# Patient Record
Sex: Female | Born: 1990
Health system: Southern US, Community
[De-identification: ages and names within clinical notes are randomized; demographics above are authoritative.]

## PROBLEM LIST (undated history)

## (undated) DIAGNOSIS — J45909 Unspecified asthma, uncomplicated: Secondary | ICD-10-CM

---
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
## 2011-10-29 ENCOUNTER — Encounter (HOSPITAL_COMMUNITY): Payer: Self-pay | Admitting: Emergency Medicine

## 2011-10-29 ENCOUNTER — Emergency Department (HOSPITAL_COMMUNITY)
Admission: EM | Admit: 2011-10-29 | Discharge: 2011-10-29 | Disposition: A | Discharge status: Home / Self Care | Payer: BC Managed Care – PPO | Source: Home / Self Care | Attending: Emergency Medicine | Admitting: Emergency Medicine

## 2011-10-29 DIAGNOSIS — J45901 Unspecified asthma with (acute) exacerbation: Secondary | ICD-10-CM

## 2011-10-29 DIAGNOSIS — J45909 Unspecified asthma, uncomplicated: Secondary | ICD-10-CM

## 2011-10-29 HISTORY — DX: Unspecified asthma, uncomplicated: J45.909

## 2011-10-29 MED ORDER — IPRATROPIUM BROMIDE 0.02 % IN SOLN
0.5000 mg | Freq: Once | RESPIRATORY_TRACT | Status: AC
  Administered 2011-10-29: 0.5 mg via RESPIRATORY_TRACT

## 2011-10-29 MED ORDER — ALBUTEROL SULFATE (5 MG/ML) 0.5% IN NEBU
5.0000 mg | INHALATION_SOLUTION | Freq: Once | RESPIRATORY_TRACT | Status: AC
  Administered 2011-10-29: 5 mg via RESPIRATORY_TRACT

## 2011-10-29 MED ORDER — BECLOMETHASONE DIPROPIONATE 80 MCG/ACT IN AERS: 2.0000 | INHALATION_SPRAY | Freq: Two times a day (BID) | RESPIRATORY_TRACT | Status: AC

## 2011-10-29 MED ORDER — METHYLPREDNISOLONE SODIUM SUCC 125 MG IJ SOLR
125.0000 mg | Freq: Once | INTRAMUSCULAR | Status: AC
  Administered 2011-10-29: 125 mg via INTRAMUSCULAR

## 2011-10-29 MED ORDER — ALBUTEROL SULFATE (5 MG/ML) 0.5% IN NEBU
INHALATION_SOLUTION | RESPIRATORY_TRACT | Status: AC
  Filled 2011-10-29: qty 0.5

## 2011-10-29 MED ORDER — PREDNISONE 5 MG PO KIT: 1.0000 | PACK | Freq: Every day | ORAL | Status: DC

## 2011-10-29 MED ORDER — METHYLPREDNISOLONE SODIUM SUCC 125 MG IJ SOLR
INTRAMUSCULAR | Status: AC
  Filled 2011-10-29: qty 2

## 2011-10-29 NOTE — ED Provider Notes (Signed)
Chief Complaint  Patient presents with  . Asthma    History of Present Illness:   The patient is a 21 year old female who has had asthma as she was a child. She usually maintains this with as needed use of albuterol by MDI inhaler. At times usually brought on by exposure to certain kinds of cats. Last night she was exposed to such a cat and ever since then has had wheezing and chest tightness, productive cough, and aching in her chest and back. She's also had some nasal congestion, rhinorrhea, sneezing, and sore throat. She denies any fever.  Review of Systems:  Other than noted above, the patient denies any of the following symptoms. Systemic:  No fever, chills, sweats, fatigue, myalgias, headache, weight loss or anorexia. Eye:  No redness, itching, or drainage. ENT:  No earache, ear congestion, nasal congestion, sneezing, rhinorrhea, sinus pressure, sinus pain, post nasal drip, or sore throat. Lungs:  No cough, sputum production, or shortness of breath. No chest pain. GI:  No indigestion, heartburn, abdominal pain, nausea, or vomiting. Skin:  No rash or itching.  PMFSH:  Past medical history, family history, social history, meds, and allergies were reviewed.  No history of allergic rhinitis.  No use of tobacco.  Physical Exam:   Vital signs:  BP 147/83  Pulse 122  Temp 98.7 F (37.1 C) (Oral)  Resp 26  SpO2 93%  LMP 10/22/2011 General:  Alert, in mild respiratory distress with all wheezes and some use of accessory muscles but no retractions. Eye:  No conjunctival injection or drainage. Lids were normal. ENT:  TMs and canals were normal, without erythema or inflammation.  Nasal mucosa was clear and uncongested, without drainage.  Mucous membranes were moist.  Pharynx was clear, without exudate or drainage.  There were no oral ulcerations or lesions. Neck:  Supple, no adenopathy, tenderness or mass. Lungs:  No retractions but some use of accessory muscles.  Mild respiratory distress with  some audible wheezes, prolonged respiration, unlabored breathing.  Lungs showed bilateral expiratory wheezes, no rales or rhonchi. Heart:  Regular rhythm, without gallops, murmers or rubs. Skin:  Clear, warm, and dry, without rash or lesions.  Course in Urgent Care Center:   Her pretreatment peak flows were 110 x3. Her pretreatment oxygen level was 93%. She was given DuoNeb via nebulizer and Solu-Medrol 125 mg per thereafter she felt a lot better. She was wheeze free on auscultation, oxygen saturation was 98%, and peak flows were 250, 210, and 240.  Assessment:  The encounter diagnosis was Asthma attack.  Plan:   1.  The following meds were prescribed:   New Prescriptions   BECLOMETHASONE (QVAR) 80 MCG/ACT INHALER    Inhale 2 puffs into the lungs 2 (two) times daily.   PREDNISONE 5 MG KIT    Take 1 kit (5 mg total) by mouth daily after breakfast. Prednisone 5 mg 6 day dosepack.  Take as directed.   She has an albuterol MDI inhaler at home that she can use.  2.  The patient was instructed in symptomatic care and handouts were given. 3.  The patient was told to return if becoming worse in any way, if no better in 3 or 4 days, and given some red flag symptoms that would indicate earlier return.  Follow up:  The patient was told to follow up with her primary care doctor.     Reuben Likes, MD 10/29/11 [phone]

## 2011-10-29 NOTE — Discharge Instructions (Signed)
Asthma Attack Prevention  HOW CAN ASTHMA BE PREVENTED?  Currently, there is no way to prevent asthma from starting. However, you can take steps to control the disease and prevent its symptoms after you have been diagnosed. Learn about your asthma and how to control it. Take an active role to control your asthma by working with your caregiver to create and follow an asthma action plan. An asthma action plan guides you in taking your medicines properly, avoiding factors that make your asthma worse, tracking your level of asthma control, responding to worsening asthma, and seeking emergency care when needed. To track your asthma, keep records of your symptoms, check your peak flow number using a peak flow meter (handheld device that shows how well air moves out of your lungs), and get regular asthma checkups.   Other ways to prevent asthma attacks include:   Use medicines as your caregiver directs.   Identify and avoid things that make your asthma worse (as much as you can).   Keep track of your asthma symptoms and level of control.   Get regular checkups for your asthma.   With your caregiver, write a detailed plan for taking medicines and managing an asthma attack. Then be sure to follow your action plan. Asthma is an ongoing condition that needs regular monitoring and treatment.   Identify and avoid asthma triggers. A number of outdoor allergens and irritants (pollen, mold, cold air, air pollution) can trigger asthma attacks. Find out what causes or makes your asthma worse, and take steps to avoid those triggers (see below).   Monitor your breathing. Learn to recognize warning signs of an attack, such as slight coughing, wheezing or shortness of breath. However, your lung function may already decrease before you notice any signs or symptoms, so regularly measure and record your peak airflow with a home peak flow meter.   Identify and treat attacks early. If you act quickly, you're less likely to have a  severe attack. You will also need less medicine to control your symptoms. When your peak flow measurements decrease and alert you to an upcoming attack, take your medicine as instructed, and immediately stop any activity that may have triggered the attack. If your symptoms do not improve, get medical help.   Pay attention to increasing quick-relief inhaler use. If you find yourself relying on your quick-relief inhaler (such as albuterol), your asthma is not under control. See your caregiver about adjusting your treatment.  IDENTIFY AND CONTROL FACTORS THAT MAKE YOUR ASTHMA WORSE  A number of common things can set off or make your asthma symptoms worse (asthma triggers). Keep track of your asthma symptoms for several weeks, detailing all the environmental and emotional factors that are linked with your asthma. When you have an asthma attack, go back to your asthma diary to see which factor, or combination of factors, might have contributed to it. Once you know what these factors are, you can take steps to control many of them.   Allergies: If you have allergies and asthma, it is important to take asthma prevention steps at home. Asthma attacks (worsening of asthma symptoms) can be triggered by allergies, which can cause temporary increased inflammation of your airways. Minimizing contact with the substance to which you are allergic will help prevent an asthma attack.  Animal Dander:    Some people are allergic to the flakes of skin or dried saliva from animals with fur or feathers. Keep these pets out of your home.   If   you can't keep a pet outdoors, keep the pet out of your bedroom and other sleeping areas at all times, and keep the door closed.   Remove carpets and furniture covered with cloth from your home. If that is not possible, keep the pet away from fabric-covered furniture and carpets.  Dust Mites:   Many people with asthma are allergic to dust mites. Dust mites are tiny bugs that are found in every  home, in mattresses, pillows, carpets, fabric-covered furniture, bedcovers, clothes, stuffed toys, fabric, and other fabric-covered items.   Cover your mattress in a special dust-proof cover.   Cover your pillow in a special dust-proof cover, or wash the pillow each week in hot water. Water must be hotter than 130 F to kill dust mites. Cold or warm water used with detergent and bleach can also be effective.   Wash the sheets and blankets on your bed each week in hot water.   Try not to sleep or lie on cloth-covered cushions.   Call ahead when traveling and ask for a smoke-free hotel room. Bring your own bedding and pillows, in case the hotel only supplies feather pillows and down comforters, which may contain dust mites and cause asthma symptoms.   Remove carpets from your bedroom and those laid on concrete, if you can.   Keep stuffed toys out of the bed, or wash the toys weekly in hot water or cooler water with detergent and bleach.  Cockroaches:   Many people with asthma are allergic to the droppings and remains of cockroaches.   Keep food and garbage in closed containers. Never leave food out.   Use poison baits, traps, powders, gels, or paste (for example, boric acid).   If a spray is used to kill cockroaches, stay out of the room until the odor goes away.  Indoor Mold:   Fix leaky faucets, pipes, or other sources of water that have mold around them.   Clean moldy surfaces with a cleaner that has bleach in it.  Pollen and Outdoor Mold:   When pollen or mold spore counts are high, try to keep your windows closed.   Stay indoors with windows closed from late morning to afternoon, if you can. Pollen and some mold spore counts are highest at that time.   Ask your caregiver whether you need to take or increase anti-inflammatory medicine before your allergy season starts.  Irritants:    Tobacco smoke is an irritant. If you smoke, ask your caregiver how you can quit. Ask family members to quit  smoking, too. Do not allow smoking in your home or car.   If possible, do not use a wood-burning stove, kerosene heater, or fireplace. Minimize exposure to all sources of smoke, including incense, candles, fires, and fireworks.   Try to stay away from strong odors and sprays, such as perfume, talcum powder, hair spray, and paints.   Decrease humidity in your home and use an indoor air cleaning device. Reduce indoor humidity to below 60 percent. Dehumidifiers or central air conditioners can do this.   Try to have someone else vacuum for you once or twice a week, if you can. Stay out of rooms while they are being vacuumed and for a short while afterward.   If you vacuum, use a dust mask from a hardware store, a double-layered or microfilter vacuum cleaner bag, or a vacuum cleaner with a HEPA filter.   Sulfites in foods and beverages can be irritants. Do not drink beer or   nose, sinus infections, reflux disease, psychological stress, and sleep apnea. Your caregiver will treat these conditions, as well.  Avoid close contact with people who have a cold or the flu, since your asthma symptoms may get worse if you catch the infection from them. Wash your hands thoroughly after touching items that may have been handled by people with a respiratory infection.  Get a flu shot every year to protect against the flu virus, which often makes asthma worse for days or weeks. Also get a pneumonia shot once every five to 10 years. Drugs:  Aspirin and other painkillers can cause asthma attacks. 10% to 20% of people with asthma have sensitivity to aspirin or a group of painkillers called non-steroidal anti-inflammatory drugs (NSAIDS), such as ibuprofen  and naproxen. These drugs are used to treat pain and reduce fevers. Asthma attacks caused by any of these medicines can be severe and even fatal. These drugs must be avoided in people who have known aspirin sensitive asthma. Products with acetaminophen are considered safe for people who have asthma. It is important that people with aspirin sensitivity read labels of all over-the-counter drugs used to treat pain, colds, coughs, and fever.  Beta blockers and ACE inhibitors are other drugs which you should discuss with your caregiver, in relation to your asthma. ALLERGY SKIN TESTING  Ask your asthma caregiver about allergy skin testing or blood testing (RAST test) to identify the allergens to which you are sensitive. If you are found to have allergies, allergy shots (immunotherapy) for asthma may help prevent future allergies and asthma. With allergy shots, small doses of allergens (substances to which you are allergic) are injected under your skin on a regular schedule. Over a period of time, your body may become used to the allergen and less responsive with asthma symptoms. You can also take measures to minimize your exposure to those allergens. EXERCISE  If you have exercise-induced asthma, or are planning vigorous exercise, or exercise in cold, humid, or dry environments, prevent exercise-induced asthma by following your caregiver's advice regarding asthma treatment before exercising. Document Released: 12/07/2008 Document Revised: 03/13/2011 Document Reviewed: 12/07/2008 [facility] Patient Information 2013 [place], [place]. Peak Flow Meter For people with asthma or certain other breathing problems, a peak flow meter is a simple but important tool to help with daily asthma management. A peak flow meter is an easy-to-use device that measures how well your lungs are working. Peak flow meters are available over-the-counter. The readings from the meter will help you and your caregiver:   Determine the  severity of your asthma.  Evaluate the effectiveness of your current treatment.  Determine when to add or stop certain medicines.  Recognize an asthma attack before signs or symptoms appear.  Decide when to seek emergency care. Your "personal best" Your "personal best" is the highest peak flow rate you can reach over a 2-3 week period when you feel good and have no asthma symptoms. Your flow rate serves as a benchmark in your daily self-management plan. Because everyone's asthma is different, your personal best will be unique to you.  Your caregiver will help you figure out your personal best. Typically, you will take readings once or twice a day for 2 weeks when you are not having symptoms. The highest consistent reading during the trial period is your "personal best."  INSTRUCTIONS FOR USE  1. Move the upper marker to the number that is your "personal best." 2. Move the lower marker to the bottom of the numbered scale.  3. Connect the mouthpiece to the peak flow meter. 4. Stand up. 5. Take a deep breath. Fill your lungs completely. 6. Place your lips tightly around the mouthpiece. Blow as hard and as fast as you can with a single breath. 7. Note the final position of the lower marker. This is your peak flow rate. 8. Move the lower marker back to the bottom of the numbered scale. 9. Repeat these steps 2 more times. Record the highest reading of the 3 tries in your asthma diary. For the most accurate readings, it is important to keep your peak flow meter clean. Follow the manufacturer's instructions on how to take care of your peak flow meter.  RESULTS You and your caregiver can use color coded "zones" on the meter to see how your peak flow rate compares to your "personal best." The color code for each zone reflects progressively more severe symptoms:  Green zone = stable   Your peak flow rates are 80 to 100 percent of your personal best. This means that your asthma is under control.  You  probably have no asthma signs or symptoms.  Take your preventive medications as usual. Yellow zone = caution   Your peak flow rates are 50 to 80 percent of your personal best. This means that your asthma is getting worse and could be improved.  You may have signs and symptoms such as coughing, wheezing or chest tightness. However, your peak flow rates may decrease before symptoms appear.  You may need to increase or change your asthma medication. Red zone = danger   Your peak flow rates are less than 50 percent of your personal best. This means that you may be in danger of a medical emergency.  You may have severe coughing, wheezing and shortness of breath. Stop whatever you are doing. Use your rescue inhaler (for example, albuterol) or other medicines to open your airways.  Your asthma action plan will help you decide whether to call your caregiver or seek emergency care. If your flow readings fall too far below your personal best into the yellow or red zone you'll need to take action to prevent or minimize an asthma attack.  RISKS AND COMPLICATIONS  Breathing too quickly may cause dizziness. At an extreme, this could cause you to pass out. Take your time so you do not get dizzy or light-headed.  If your lips are not placed tightly around the peak flow meter mouthpiece, you will get incorrect low readings. AFTER USE  Rest and breathe slowly and easily.  Keep a record of your progress. Your caregiver can provide you with a simple table to help with this. WHEN TO USE Your caregiver will give you instructions on when to do regular monitoring. You may also need to check your peak flow when:   Asthma symptoms wake you up at night.  You have increased symptoms during the day.  You have a cold, flu or other illness that affects your breathing.  You need quick relief "rescue medication." (If possible, check your peak flow before you use rescue medication. Check it again 20 or 30 minutes  after taking medication.) Document Released: 10/16/2006 Document Revised: 03/13/2011 Document Reviewed: 03/14/2007 [facility] Patient Information 2013 [place], [place].

## 2011-10-29 NOTE — ED Notes (Signed)
Pt reports " I went to a friend's house where they had cats and I am allergic to cats. I used my inhaler but it's not helping anymore."

## 2012-06-25 IMAGING — CR DG WRIST COMPLETE 3+V*L*
4 series · 4 of 4 positions shown · non-contrast
Comparison: None.

CLINICAL DATA: Status post fall onto left wrist, with left wrist
pain.

LEFT WRIST - COMPLETE 3+ VIEW

[x wrist pa left]
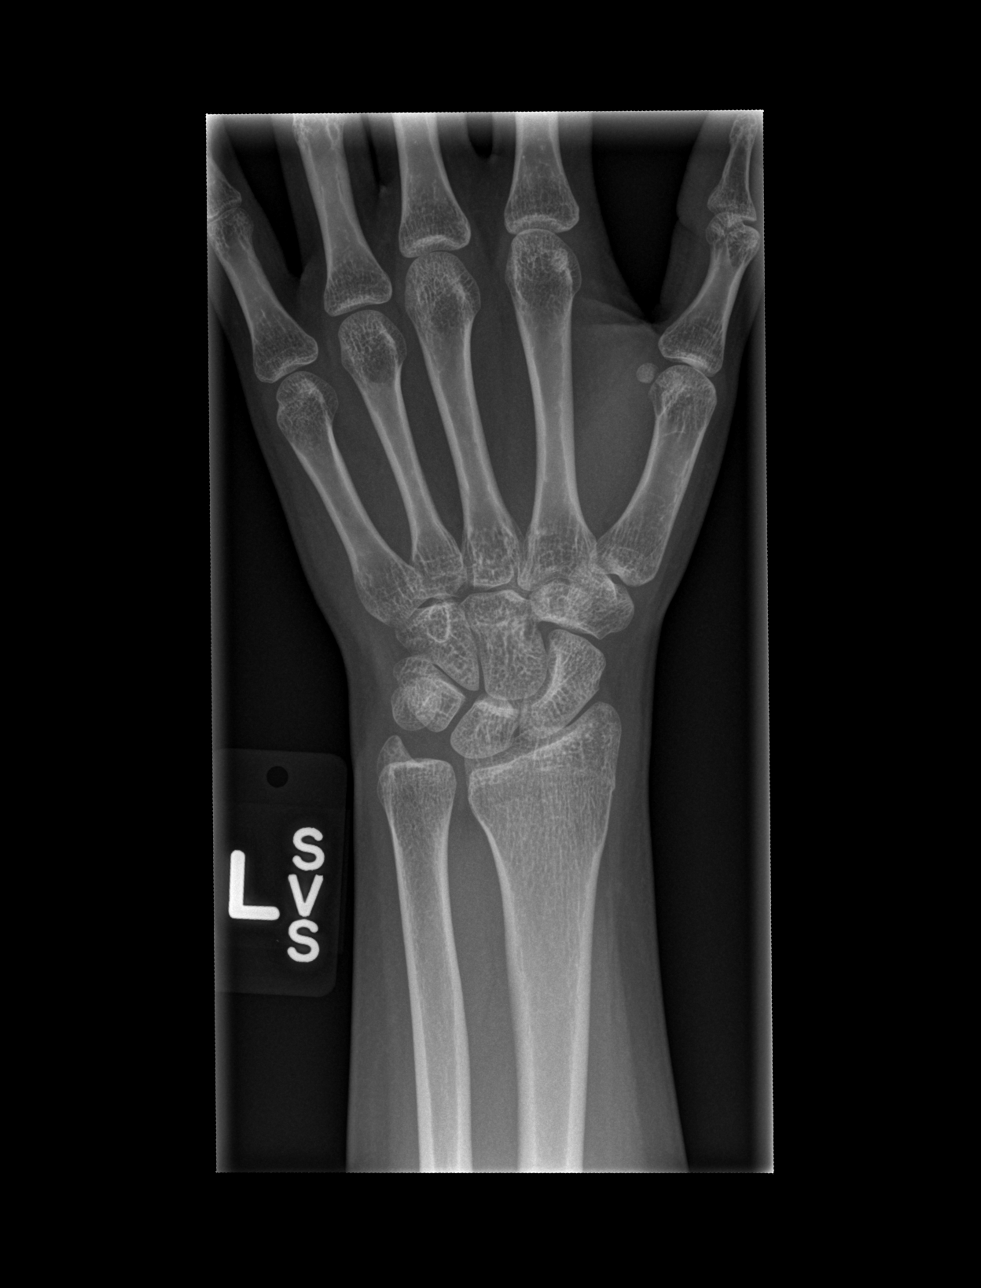

[x wrist obl left]
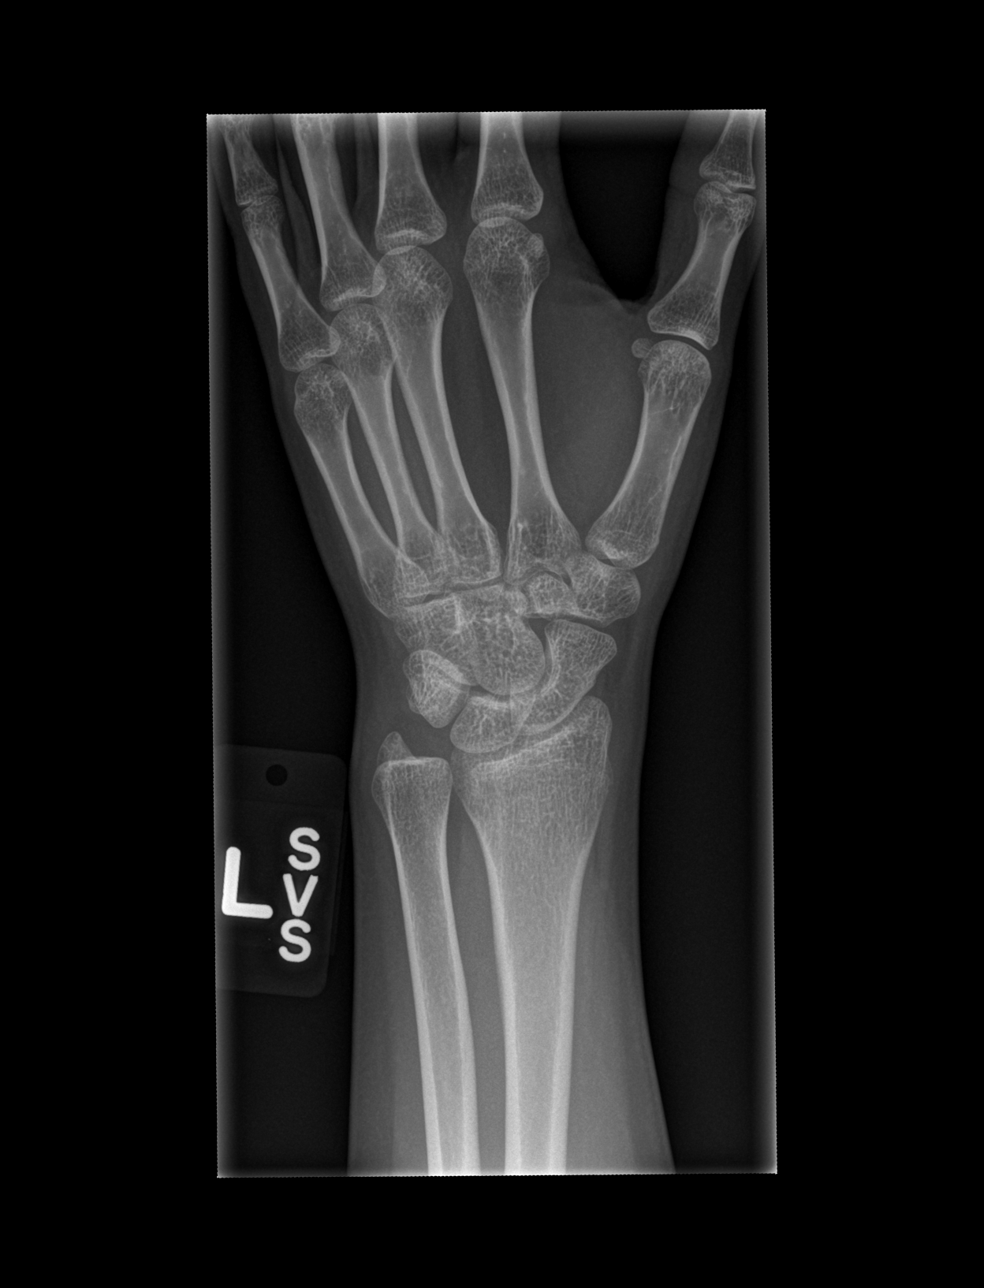

[x wrist lat left]
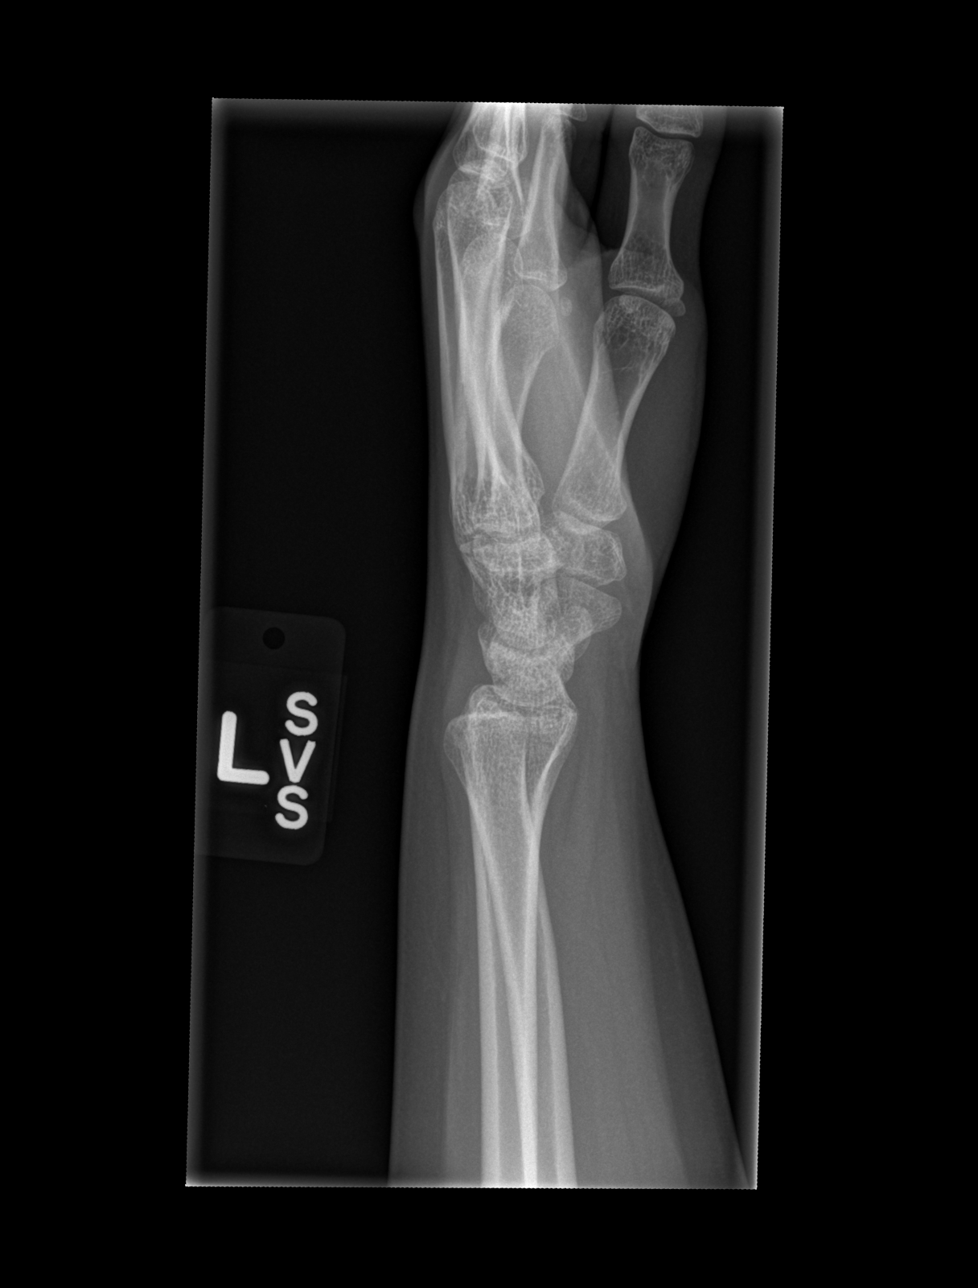

[x wrist navicular view left]
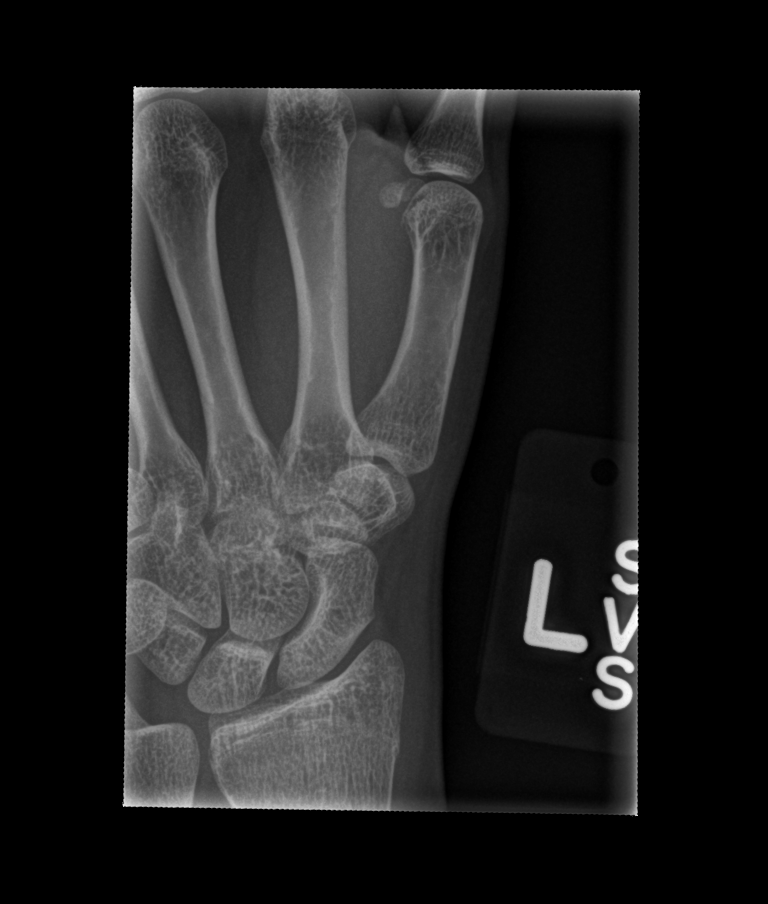

[4 of 4 positions shown; findings below may reference images not displayed]

FINDINGS: There is no evidence of fracture or dislocation.  The
carpal rows are intact, and demonstrate normal alignment.  The
joint spaces are preserved.

No significant soft tissue abnormalities are seen.
IMPRESSION: No evidence of fracture or dislocation.

## 2013-06-12 ENCOUNTER — Encounter (HOSPITAL_COMMUNITY): Payer: Self-pay | Admitting: Emergency Medicine

## 2013-06-12 ENCOUNTER — Emergency Department (HOSPITAL_COMMUNITY)
Admission: EM | Admit: 2013-06-12 | Discharge: 2013-06-13 | Disposition: A | Discharge status: Home / Self Care | Payer: BC Managed Care – PPO | Attending: Emergency Medicine | Admitting: Emergency Medicine

## 2013-06-12 DIAGNOSIS — IMO0002 Reserved for concepts with insufficient information to code with codable children: Secondary | ICD-10-CM | POA: Insufficient documentation

## 2013-06-12 DIAGNOSIS — Y9389 Activity, other specified: Secondary | ICD-10-CM | POA: Insufficient documentation

## 2013-06-12 DIAGNOSIS — Z79899 Other long term (current) drug therapy: Secondary | ICD-10-CM | POA: Insufficient documentation

## 2013-06-12 DIAGNOSIS — S60512A Abrasion of left hand, initial encounter: Secondary | ICD-10-CM

## 2013-06-12 DIAGNOSIS — S40811A Abrasion of right upper arm, initial encounter: Secondary | ICD-10-CM

## 2013-06-12 DIAGNOSIS — Y92410 Unspecified street and highway as the place of occurrence of the external cause: Secondary | ICD-10-CM | POA: Insufficient documentation

## 2013-06-12 DIAGNOSIS — S80211A Abrasion, right knee, initial encounter: Secondary | ICD-10-CM

## 2013-06-12 DIAGNOSIS — S0001XA Abrasion of scalp, initial encounter: Secondary | ICD-10-CM

## 2013-06-12 DIAGNOSIS — J45909 Unspecified asthma, uncomplicated: Secondary | ICD-10-CM | POA: Insufficient documentation
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
## 2013-06-13 MED ORDER — IBUPROFEN 600 MG PO TABS: 600.0000 mg | ORAL_TABLET | Freq: Four times a day (QID) | ORAL | Status: AC | PRN

## 2013-06-13 MED ORDER — HYDROCODONE-ACETAMINOPHEN 5-325 MG PO TABS
1.0000 | ORAL_TABLET | Freq: Once | ORAL | Status: AC
  Administered 2013-06-13: 1 via ORAL
  Filled 2013-06-13: qty 1

## 2013-06-13 MED ORDER — TRAMADOL HCL 50 MG PO TABS: 50.0000 mg | ORAL_TABLET | Freq: Four times a day (QID) | ORAL | Status: AC | PRN

## 2013-06-13 NOTE — Discharge Instructions (Signed)
Abrasion °An abrasion is a cut or scrape of the skin. Abrasions do not extend through all layers of the skin and most heal within 10 days. It is important to care for your abrasion properly to prevent infection. °CAUSES  °Most abrasions are caused by falling on, or gliding across, the ground or other surface. When your skin rubs on something, the outer and inner layer of skin rubs off, causing an abrasion. °DIAGNOSIS  °Your caregiver will be able to diagnose an abrasion during a physical exam.  °TREATMENT  °Your treatment depends on how large and deep the abrasion is. Generally, your abrasion will be cleaned with water and a mild soap to remove any dirt or debris. An antibiotic ointment may be put over the abrasion to prevent an infection. A bandage (dressing) may be wrapped around the abrasion to keep it from getting dirty.  °You may need a tetanus shot if: °· You cannot remember when you had your last tetanus shot. °· You have never had a tetanus shot. °· The injury broke your skin. °If you get a tetanus shot, your arm may swell, get red, and feel warm to the touch. This is common and not a problem. If you need a tetanus shot and you choose not to have one, there is a rare chance of getting tetanus. Sickness from tetanus can be serious.  °HOME CARE INSTRUCTIONS  °· If a dressing was applied, change it at least once a day or as directed by your caregiver. If the bandage sticks, soak it off with warm water.   °· Wash the area with water and a mild soap to remove all the ointment 2 times a day. Rinse off the soap and pat the area dry with a clean towel.   °· Reapply any ointment as directed by your caregiver. This will help prevent infection and keep the bandage from sticking. Use gauze over the wound and under the dressing to help keep the bandage from sticking.   °· Change your dressing right away if it becomes wet or dirty.   °· Only take over-the-counter or prescription medicines for pain, discomfort, or fever as  directed by your caregiver.   °· Follow up with your caregiver within 24 48 hours for a wound check, or as directed. If you were not given a wound-check appointment, look closely at your abrasion for redness, swelling, or pus. These are signs of infection. °SEEK IMMEDIATE MEDICAL CARE IF:  °· You have increasing pain in the wound.   °· You have redness, swelling, or tenderness around the wound.   °· You have pus coming from the wound.   °· You have a fever or persistent symptoms for more than 2 3 days. °· You have a fever and your symptoms suddenly get worse. °· You have a bad smell coming from the wound or dressing.   °MAKE SURE YOU:  °· Understand these instructions. °· Will watch your condition. °· Will get help right away if you are not doing well or get worse. °Document Released: 09/28/2004 Document Revised: 12/06/2011 Document Reviewed: 11/22/2010 °Exit[employer]® Patient Information ©2014 [employer]. ° °

## 2013-06-13 NOTE — ED Notes (Addendum)
Patient left brown sandals under ED stretcher. This RN placed them in patients belonging bag with a patient sticker. Sandals placed at nurse first- patient was called and made aware- pt states, "I will come pick them up tomorrow."
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
## 2016-11-13 ENCOUNTER — Encounter (HOSPITAL_COMMUNITY): Payer: Self-pay | Admitting: Emergency Medicine

## 2016-11-13 ENCOUNTER — Ambulatory Visit (HOSPITAL_COMMUNITY)
Admission: EM | Admit: 2016-11-13 | Discharge: 2016-11-13 | Disposition: A | Discharge status: Home / Self Care | Payer: BLUE CROSS/BLUE SHIELD | Attending: Emergency Medicine | Admitting: Emergency Medicine

## 2016-11-13 DIAGNOSIS — M25511 Pain in right shoulder: Secondary | ICD-10-CM

## 2016-11-13 MED ORDER — CYCLOBENZAPRINE HCL 5 MG PO TABS: 5.0000 mg | ORAL_TABLET | Freq: Every evening | ORAL | 0 refills | Status: AC | PRN

## 2016-11-13 MED ORDER — NAPROXEN 500 MG PO TABS: 500.0000 mg | ORAL_TABLET | Freq: Two times a day (BID) | ORAL | 0 refills | Status: AC

## 2016-11-13 NOTE — ED Provider Notes (Signed)
[facility]    CSN: [id number] Arrival date & time: 11/13/16  1315     History   Chief Complaint Chief Complaint  Patient presents with  . Shoulder Pain    HPI Shelley GlassmanJessica Skyles is a 26 y.o. female.   26 year old female comes in for few month history of intermittent right shoulder pains. Patient states she recently obtained health insurance and is in the processes of looking for a PCP, but pain worsened and came in for evaluation. She states for the past few months, she would occasionally wake up with right shoulder/neck pain with spasms that will prevent her from doing work. She sleeps with her arm below her head, and has an old mattress that she thinks could be playing part to it. 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 She denies any recent injury/trauma, numbness/tingling/ swelling. She usually takes ibuprofen 400-600mg  BID-TID with good relief. Current episode has been lasting the last few days with worsening pain, she took one of her family member's norco with good improvement. Denies chest pain, shortness of breath, nausea, vomiting.       Past Medical History:  Diagnosis Date  . Asthma     There are no active problems to display for this patient.   History reviewed. No pertinent surgical history.  OB History    No data available       Home Medications    Prior to Admission medications   Medication Sig Start Date End Date Taking? Authorizing Provider  beclomethasone (QVAR) 80 MCG/ACT inhaler Inhale 2 puffs into the lungs 2 (two) times daily. 10/29/11   Reuben LikesKeller, David C, MD  cyclobenzaprine (FLEXERIL) 5 MG tablet Take 1-2 tablets (5-10 mg total) at bedtime as needed by mouth for muscle spasms. 11/13/16   Cathie HoopsYu, Amy V, PA-C  ibuprofen (ADVIL,MOTRIN) 600 MG tablet Take 1 tablet (600 mg total) by mouth every 6 (six) hours as needed. 06/13/13   Loren RacerYelverton, David,  MD  methylphenidate (RITALIN) 10 MG tablet Take 10 mg by mouth 2 (two) times daily.    [provider]  naproxen (NAPROSYN) 500 MG tablet Take 1 tablet (500 mg total) 2 (two) times daily for 10 days by mouth. 11/13/16 11/23/16  Cathie HoopsYu, Amy V, PA-C  traMADol (ULTRAM) 50 MG tablet Take 1 tablet (50 mg total) by mouth every 6 (six) hours as needed. 06/13/13   Loren RacerYelverton, David, MD    Family History History reviewed. No pertinent family history.  Social History Social History   Tobacco Use  . Smoking status: Never Smoker  Substance Use Topics  . Alcohol use: Yes  . Drug use: No     Allergies   Patient has no known allergies.   Review of Systems Review of Systems  Reason unable to perform ROS: See HPI as above.     Physical Exam Triage Vital Signs ED Triage Vitals [11/13/16 1335]  Enc Vitals Group     BP 111/71     Pulse Rate 93     Resp 18     Temp 98.1 F (36.7 C)     Temp Source Oral     SpO2 100 %     Weight      Height      Head Circumference      Peak Flow      Pain Score 3     Pain Loc  Pain Edu?      Excl. in GC?    No data found.  Updated Vital Signs BP 111/71 (BP Location: Right Arm)   Pulse 93   Temp 98.1 F (36.7 C) (Oral)   Resp 18   SpO2 100%   Physical Exam  Constitutional: She is oriented to person, place, and time. She appears well-developed and well-nourished. No distress.  HENT:  Head: Normocephalic and atraumatic.  Eyes: Conjunctivae are normal. Pupils are equal, round, and reactive to light.  Cardiovascular: Normal rate, regular rhythm and normal heart sounds. Exam reveals no gallop and no friction rub.  No murmur heard. Pulmonary/Chest: Effort normal and breath sounds normal. She has no wheezes. She has no rales.  Musculoskeletal:  No tenderness on palpation of the spinous processes. Tenderness to palpation of the right upper back/trapezius. Full range of motion of neck and shoulder. Strength normal and equal bilaterally.  Sensation intact and equal bilaterally.  Radial pulses 2+ and equal bilaterally. Capillary refill less than 2 seconds.   Neurological: She is alert and oriented to person, place, and time.  Skin: Skin is warm and dry.     UC Treatments / Results  Labs (all labs ordered are listed, but only abnormal results are displayed) Labs Reviewed - No data to display  EKG  EKG Interpretation None       Radiology No results found.  Procedures Procedures (including critical care time)  Medications Ordered in UC Medications - No data to display   Initial Impression / Assessment and Plan / UC Course  I have reviewed the triage vital signs and the nursing notes.  Pertinent labs & imaging results that were available during my care of the patient were reviewed by me and considered in my medical decision making (see chart for details).    Discussed possible inflammatory processes. Start naproxen as directed. Muscle relaxant for muscle spasms as needed. Patient to establish PCP care and follow up for further evaluation needed.  Final Clinical Impressions(s) / UC Diagnoses   Final diagnoses:  Acute pain of right shoulder    ED Discharge Orders        Ordered    naproxen (NAPROSYN) 500 MG tablet  2 times daily     11/13/16 1404    cyclobenzaprine (FLEXERIL) 5 MG tablet  At bedtime PRN     11/13/16 1404        Belinda FisherYu, Amy V, PA-C 11/13/16 1421

## 2016-11-13 NOTE — ED Triage Notes (Signed)
Pt c/o right sided neck and shoulder pain that is spasm in nature intermittently

## 2016-11-13 NOTE — Discharge Instructions (Addendum)
Your symptoms could be due to inflammation of the muscle/tendons. Start Naproxen as directed. Flexeril as needed at night. Flexeril can make you drowsy, so do not take if you are going to drive, operate heavy machinery, or make important decisions. Ice/heat compresses as needed. This can take up to 3-4 weeks to completely resolve, but you should be feeling better each week. Follow up here or with PCP if symptoms worsen, changes for reevaluation.

## 2017-08-21 ENCOUNTER — Other Ambulatory Visit: Payer: Self-pay | Admitting: Family Medicine

## 2017-08-21 ENCOUNTER — Other Ambulatory Visit (HOSPITAL_COMMUNITY)
Admission: RE | Admit: 2017-08-21 | Discharge: 2017-08-21 | Disposition: A | Discharge status: Home / Self Care | Payer: Commercial Managed Care - PPO | Source: Ambulatory Visit | Attending: Family Medicine | Admitting: Family Medicine

## 2017-08-21 DIAGNOSIS — Z23 Encounter for immunization: Secondary | ICD-10-CM | POA: Diagnosis not present

## 2017-08-21 DIAGNOSIS — Z124 Encounter for screening for malignant neoplasm of cervix: Secondary | ICD-10-CM | POA: Insufficient documentation

## 2017-08-21 DIAGNOSIS — Z0000 Encounter for general adult medical examination without abnormal findings: Secondary | ICD-10-CM | POA: Diagnosis not present

## 2017-08-23 LAB — CYTOLOGY - PAP
CHLAMYDIA, DNA PROBE: NEGATIVE
DIAGNOSIS: NEGATIVE
Neisseria Gonorrhea: NEGATIVE

## 2017-09-18 DIAGNOSIS — M542 Cervicalgia: Secondary | ICD-10-CM | POA: Diagnosis not present

## 2017-09-26 ENCOUNTER — Other Ambulatory Visit: Payer: Self-pay

## 2017-09-26 ENCOUNTER — Ambulatory Visit: Payer: Commercial Managed Care - PPO | Attending: Family Medicine

## 2017-09-26 DIAGNOSIS — M25511 Pain in right shoulder: Secondary | ICD-10-CM | POA: Insufficient documentation

## 2017-09-26 DIAGNOSIS — R252 Cramp and spasm: Secondary | ICD-10-CM | POA: Insufficient documentation

## 2017-09-26 DIAGNOSIS — M542 Cervicalgia: Secondary | ICD-10-CM | POA: Diagnosis present

## 2017-09-26 DIAGNOSIS — G8929 Other chronic pain: Secondary | ICD-10-CM | POA: Insufficient documentation

## 2017-09-26 DIAGNOSIS — M6281 Muscle weakness (generalized): Secondary | ICD-10-CM | POA: Insufficient documentation

## 2017-09-26 NOTE — Patient Instructions (Signed)
Access Code: [email or web address]  URL: [email or web address]  Date: 09/26/2017  Prepared by: Lorrene Reid   Exercises  Cat-Camel - 10 reps - 1 sets - 5 hold - 2x daily - 7x weekly  Child's Pose Stretch - 3 reps - 2x daily - 7x weekly  Child's Pose with Sidebending - 3 reps - 2x daily - 7x weekly  Seated Cervical Flexion AROM - 3 reps - 1 sets - 20 hold - 3x daily - 7x weekly  Seated Cervical Sidebending AROM - 3 reps - 1 sets - 20 hold - 3x daily - 7x weekly  Seated Cervical Rotation AROM - 3 reps - 1 sets - 20 hold - 3x daily - 7x weekly  Seated Correct Posture - 10 reps - 3 sets - 1x daily - 7x weekly
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
## 2017-10-04 ENCOUNTER — Ambulatory Visit: Payer: Commercial Managed Care - PPO | Attending: Family Medicine | Admitting: Physical Therapy

## 2017-10-04 DIAGNOSIS — M542 Cervicalgia: Secondary | ICD-10-CM | POA: Insufficient documentation

## 2017-10-04 DIAGNOSIS — G8929 Other chronic pain: Secondary | ICD-10-CM | POA: Diagnosis present

## 2017-10-04 DIAGNOSIS — M25511 Pain in right shoulder: Secondary | ICD-10-CM | POA: Insufficient documentation

## 2017-10-04 DIAGNOSIS — R252 Cramp and spasm: Secondary | ICD-10-CM | POA: Insufficient documentation

## 2017-10-04 DIAGNOSIS — M6281 Muscle weakness (generalized): Secondary | ICD-10-CM | POA: Diagnosis present

## 2017-10-04 NOTE — Patient Instructions (Signed)
Trigger Point Dry Needling  . What is Trigger Point Dry Needling (DN)? o DN is a physical therapy technique used to treat muscle pain and dysfunction. Specifically, DN helps deactivate muscle trigger points (muscle knots).  o A thin filiform needle is used to penetrate the skin and stimulate the underlying trigger point. The goal is for a local twitch response (LTR) to occur and for the trigger point to relax. No medication of any kind is injected during the procedure.   . What Does Trigger Point Dry Needling Feel Like?  o The procedure feels different for each individual patient. Some patients report that they do not actually feel the needle enter the skin and overall the process is not painful. Very mild bleeding may occur. However, many patients feel a deep cramping in the muscle in which the needle was inserted. This is the local twitch response.   Marland Kitchen How Will I feel after the treatment? o Soreness is normal, and the onset of soreness may not occur for a few hours. Typically this soreness does not last longer than two days.  o Bruising is uncommon, however; ice can be used to decrease any possible bruising.  o In rare cases feeling tired or nauseous after the treatment is normal. In addition, your symptoms may get worse before they get better, this period will typically not last longer than 24 hours.   . What Can I do After My Treatment? o Increase your hydration by drinking more water for the next 24 hours. o You may place ice or heat on the areas treated that have become sore, however, do not use heat on inflamed or bruised areas. Heat often brings more relief post needling. o You can continue your regular activities, but vigorous activity is not recommended initially after the treatment for 24 hours. o DN is best combined with other physical therapy such as strengthening, stretching, and other therapies.    Precautions:  In some cases, dry needling is done over the lung field. While rare,  there is a risk of pneumothorax (punctured lung). Because of this, if you ever experience shortness of breath on exertion, difficulty taking a deep breath, chest pain or a dry cough following dry needling, you should report to an emergency room and tell them that you have been dry needled over the thorax.   Solon Palm, PT 10/04/17 8:44 AM [facility] [address] [place], [place] [address] Phone # [phone] Fax [phone]

## 2017-10-04 NOTE — Therapy (Signed)
[facility] [address] [place], [place], [address] Phone: [phone]   Fax:  [phone]  Physical Therapy Treatment  Patient Details  Name: Shelley Henderson MRN: [id number] Date of Birth: 1990/09/15 Referring Provider (PT): Marda Stalker, [place]   Encounter Date: 10/04/2017  PT End of Session - 10/04/17 0757    Visit Number  2    Date for PT Re-Evaluation  11/21/17    Authorization Type  UHC    PT Start Time  0758    PT Stop Time  0845    PT Time Calculation (min)  47 min    Activity Tolerance  Patient tolerated treatment well    Behavior During Therapy  [facility] for tasks assessed/performed       Past Medical History:  Diagnosis Date  . Asthma     No past surgical history on file.  There were no vitals filed for this visit.  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 She is compliant with neck exercises and feel they help in the field.    Pertinent History  none    How long can you sit comfortably?  has to call out of work sometimes due to pain    Diagnostic tests  none    Patient Stated Goals  reduce neck and shoulder pain with work    Currently in Pain?  Yes    Pain Score  3     Pain Location  Neck    Pain Orientation  Right    Pain Descriptors / Indicators  Sore    Pain Type  Chronic pain    Pain Radiating Towards  Rt scapula and arm    Pain Onset  More than a month ago    Pain Frequency  Constant                       [facility] Adult PT Treatment/Exercise - 10/04/17 0001      Exercises   Exercises  Neck      Neck Exercises: Machines for Strengthening   UBE (Upper Arm Bike)  L3 x 4 min 74fd/2bwd    Cybex Row  25# 2x10    Lat Pull  25# 2x10      Neck Exercises: Theraband   Horizontal ABduction  Green;20 reps      Neck Exercises: Standing   Upper Extremity D2  Extension;10 reps;Theraband    Bil    Theraband Level (UE D2)  Level 3 (Green)      Manual Therapy   Manual Therapy  Soft tissue mobilization;Myofascial release    Soft tissue mobilization  to R UT, levator,rhomboids, IS and subscapularis    Myofascial Release  TP release to rhomboids R      Neck Exercises: Stretches   Upper Trapezius Stretch  Right;1 rep;30 seconds    Levator Stretch  Right;1 rep;30 seconds    Other Neck Stretches  childs pose bil x 20 sec ea    Other Neck Stretches  cat/cow x 5       Trigger Point Dry Needling - 10/04/17 0846    Consent Given?  Yes    Education Handout Provided  Yes    Muscles Treated Upper Body  Upper trapezius;Subscapularis    Upper Trapezius Response  Twitch reponse elicited;Palpable increased muscle length   R   Subscapularis Response  Twitch response elicited   mild          PT Education - 10/04/17 0850    Education Details  DN education and aftercare    Person(s) Educated  Patient    Methods  Explanation;Handout    Comprehension  Verbalized understanding       PT Short Term Goals - 09/26/17 0750      PT SHORT TERM GOAL #1   Title  be independent in initial HEP    Time  4    Period  Weeks    Status  New    Target Date  10/24/17      PT SHORT TERM GOAL #2   Title  report a 25% reduction in Rt UE and neck pain with work tasks    Time  4    Period  Weeks    Status  New    Target Date  10/24/17      PT SHORT TERM GOAL #3   Title  report neutral posture and postural corrections at least 50% of the time with home and work tasks     Time  4    Period  Weeks    Status  New    Target Date  10/24/17        PT Long Term Goals - 09/26/17 0807      PT LONG TERM GOAL #1   Title  be independent in advanced HEP    Time  8    Period  Weeks    Status  New    Target Date  11/21/17      PT LONG TERM GOAL #2   Title  recuce FOTO to < or = to 36% limitation    Time  8    Period  Weeks    Status  New    Target Date  11/21/17      PT LONG TERM GOAL  #3   Title  improve postural strength to report 50% improved enudrance with use of Rt UE for repetative work tasks     Time  8    Period  Weeks    Status  New    Target Date  11/21/17      PT LONG TERM GOAL #4   Title  report a 60% reduction in neck and Rt UE pain with work tasks     Time  8    Period  Weeks    Status  New    Target Date  11/21/17      PT LONG TERM GOAL #5   Title  improve postural strength to demonstrate neutral posture > 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 HEP reviewed and pt reports compliance. She was educated re: DN and gave consent. Very good response in R UT; minimal response in subscapularis although TPs palpated. No goals met as only second visit.    Rehab Potential  Excellent    PT Frequency  2x / week    PT Duration  8 weeks    PT Treatment/Interventions  ADLs/Self Care Home Management;Cryotherapy;Electrical Stimulation;Ultrasound;Moist Heat;Traction;Functional mobility training;Therapeutic activities;Therapeutic exercise;Patient/family education;Manual techniques;Passive range of motion;Taping;Dry needling  PT Next Visit Plan  dry needling to bil upper traps, cervical and thoracic multifidi and Rt scapular area to address trigger points, theraband for postural strength, Progress HEP.    PT Home Exercise Plan   Access Code: [email or web address]     Consulted and Agree with Plan of Care  Patient       Patient will benefit from skilled therapeutic intervention in order to improve the following deficits and impairments:  Decreased activity tolerance, Decreased endurance, Decreased range of motion, Decreased strength, Improper body mechanics, Pain, Postural dysfunction, Increased muscle spasms, Impaired UE functional use  Visit Diagnosis: Cervicalgia  Chronic right  shoulder pain  Cramp and spasm  Muscle weakness (generalized)     Problem List There are no active problems to display for this patient.   Jabrea Kallstrom PT 10/04/2017, 8:55 AM  [facility] [address] [place], [place], [address] Phone: [phone]   Fax:  [phone]  Name: Shelley Henderson MRN: [id number] Date of Birth: December 21, 1990

## 2017-10-11 ENCOUNTER — Ambulatory Visit: Payer: Commercial Managed Care - PPO | Admitting: Physical Therapy

## 2017-10-11 DIAGNOSIS — M6281 Muscle weakness (generalized): Secondary | ICD-10-CM

## 2017-10-11 DIAGNOSIS — M542 Cervicalgia: Secondary | ICD-10-CM

## 2017-10-11 DIAGNOSIS — M25511 Pain in right shoulder: Secondary | ICD-10-CM

## 2017-10-11 DIAGNOSIS — G8929 Other chronic pain: Secondary | ICD-10-CM

## 2017-10-11 DIAGNOSIS — R252 Cramp and spasm: Secondary | ICD-10-CM

## 2017-10-11 NOTE — Therapy (Signed)
[facility] [address] [place], [place], [address] Phone: [phone]   Fax:  [phone]  Physical Therapy Treatment  Patient Details  Name: Shelley Henderson MRN: [id number] Date of Birth: 1990/03/28 Referring Provider (PT): Jarrett Soho, [place]   Encounter Date: 10/11/2017  PT End of Session - 10/11/17 0758    Visit Number  3    Date for PT Re-Evaluation  11/21/17    Authorization Type  UHC    PT Start Time  0758    PT Stop Time  0849    PT Time Calculation (min)  51 min    Activity Tolerance  Patient tolerated treatment well    Behavior During Therapy  [facility] for tasks assessed/performed       Past Medical History:  Diagnosis Date  . Asthma     No past surgical history on file.  There were no vitals filed for this visit.  Subjective Assessment - 10/11/17 0803    Subjective  Patient had a wedding shoot Saturday. Monday night her arm started hurting     Currently in Pain?  No/denies                       [facility] Adult PT Treatment/Exercise - 10/11/17 0001      Exercises   Exercises  Shoulder      Neck Exercises: Machines for Strengthening   UBE (Upper Arm Bike)  L3 x 4 min 27fwd/2bwd    Cybex Row  30# 2x10 lower grip; 25# 2x10    Lat Pull  35# 2x10      Neck Exercises: Theraband   Horizontal ABduction  Green;20 reps      Neck Exercises: Standing   Upper Extremity D2  Extension;10 reps;Theraband   Bil    Theraband Level (UE D2)  Level 3 (Green)      Shoulder Exercises: Standing   External Rotation  Strengthening;Right;20 reps;Theraband    Theraband Level (Shoulder External Rotation)  Level 3 (Green)    External Rotation Limitations  in 90 deg ABD also    Internal Rotation  Strengthening;Right;20 reps;Theraband    Theraband Level (Shoulder Internal Rotation)  Level 3 (Green)      Shoulder Exercises: Pulleys   Other Pulley Exercises  ER/IR 15# Right 1x10 ea      Manual  Therapy   Manual Therapy  Soft tissue mobilization    Soft tissue mobilization  to Bil UT. Rt pecs and inraspinatus       Trigger Point Dry Needling - 10/11/17 1410    Consent Given?  Yes    Muscles Treated Upper Body  Upper trapezius;Levator scapulae;Infraspinatus;Pectoralis major    Upper Trapezius Response  Twitch reponse elicited;Palpable increased muscle length   BIL   Pectoralis Major Response  Twitch response elicited;Palpable increased muscle length    Levator Scapulae Response  Twitch response elicited;Palpable increased muscle length   RIGHT   Infraspinatus Response  Twitch response elicited;Palpable increased muscle length   RIGHT          PT Education - 10/11/17 1409    Education Details  HEP    Person(s) Educated  Patient    Methods  Explanation;Demonstration;Handout    Comprehension  Verbalized understanding;Returned demonstration       PT Short Term Goals - 10/11/17 0806      PT SHORT TERM GOAL #1   Title  be independent in initial HEP    Time  4  Period  Weeks    Status  On-going      PT SHORT TERM GOAL #2   Title  report a 25% reduction in Rt UE and neck pain with work tasks    Time  4    Period  Weeks    Status  On-going      PT SHORT TERM GOAL #3   Title  report neutral posture and postural corrections at least 50% of the time with home and work tasks     Time  4    Period  Weeks    Status  On-going        PT Long Term Goals - 09/26/17 0807      PT LONG TERM GOAL #1   Title  be independent in advanced HEP    Time  8    Period  Weeks    Status  New    Target Date  11/21/17      PT LONG TERM GOAL #2   Title  recuce FOTO to < or = to 36% limitation    Time  8    Period  Weeks    Status  New    Target Date  11/21/17      PT LONG TERM GOAL #3   Title  improve postural strength to report 50% improved enudrance with use of Rt UE for repetative work tasks     Time  8    Period  Weeks    Status  New    Target Date  11/21/17       PT LONG TERM GOAL #4   Title  report a 60% reduction in neck and Rt UE pain with work tasks     Time  8    Period  Weeks    Status  New    Target Date  11/21/17      PT LONG TERM GOAL #5   Title  improve postural strength to demonstrate neutral posture > 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 She demos weakness in R shoulder with Tband exercises, mainly with ER. Great response in R pectorals with DN today.    PT Treatment/Interventions  ADLs/Self Care Home Management;Cryotherapy;Electrical Stimulation;Ultrasound;Moist Heat;Traction;Functional mobility training;Therapeutic activities;Therapeutic exercise;Patient/family education;Manual techniques;Passive range of motion;Taping;Dry needling    PT Next Visit Plan  dry needling to bil upper traps, cervical and thoracic multifidi and Rt scapular area to address trigger points, theraband for postural strength    Consulted and Agree with Plan of Care  Patient       Patient will benefit from skilled therapeutic intervention in order to improve the following deficits and impairments:  Decreased activity tolerance, Decreased endurance, Decreased range of motion, Decreased strength, Improper body mechanics, Pain, Postural dysfunction, Increased muscle spasms, Impaired UE functional use  Visit Diagnosis: Cervicalgia  Chronic right shoulder pain  Cramp and spasm  Muscle weakness (generalized)     Problem List There are no active problems to display for this patient.   Avyanna Spada PT 10/11/2017, 2:16 PM  [facility] [address], [address] [place], [place], [address] Phone: [phone]  Fax:  [phone]  Name: Sujata Maines MRN: [id number] Date of Birth: [birth year]

## 2017-10-11 NOTE — Patient Instructions (Signed)
    Solon Palm, PT 10/11/17 8:48 AM; [facility] [address], [address] [place], [place] [address] Phone # [phone] Fax [phone]

## 2017-10-18 ENCOUNTER — Ambulatory Visit: Payer: Commercial Managed Care - PPO | Admitting: Physical Therapy

## 2017-10-18 DIAGNOSIS — M542 Cervicalgia: Secondary | ICD-10-CM | POA: Diagnosis not present

## 2017-10-18 DIAGNOSIS — M6281 Muscle weakness (generalized): Secondary | ICD-10-CM

## 2017-10-18 DIAGNOSIS — G8929 Other chronic pain: Secondary | ICD-10-CM

## 2017-10-18 DIAGNOSIS — R252 Cramp and spasm: Secondary | ICD-10-CM

## 2017-10-18 DIAGNOSIS — M25511 Pain in right shoulder: Secondary | ICD-10-CM

## 2017-10-18 NOTE — Therapy (Signed)
[facility] [address], [address] [place], [place], [address] Phone: [phone]   Fax:  [phone]  Physical Therapy Treatment  Patient Details  Name: Shelley Henderson MRN: [id number] Date of Birth: [birth year] Referring Provider (PT): Jarrett Soho, [place]   Encounter Date: 10/18/2017  PT End of Session - 10/18/17 0800    Visit Number  4    Date for PT Re-Evaluation  11/21/17    Authorization Type  UHC    PT Start Time  0800    PT Stop Time  0848    PT Time Calculation (min)  48 min    Activity Tolerance  Patient tolerated treatment well    Behavior During Therapy  [facility] for tasks assessed/performed       Past Medical History:  Diagnosis Date  . Asthma     No past surgical history on file.  There were no vitals filed for this visit.  Subjective Assessment - 10/18/17 0800    Subjective  Its a little sore this morning but I think I just slept on it wrong.    Patient Stated Goals  reduce neck and shoulder pain with work    Currently in Pain?  No/denies                       [facility] Adult PT Treatment/Exercise - 10/18/17 0001      Exercises   Exercises  Shoulder      Neck Exercises: Machines for Strengthening   UBE (Upper Arm Bike)  L3 x 4 min 46fwd/2bwd    Cybex Row  30# 2x10 lower grip; 30# 2x10 mid grip    Lat Pull  35# 2x10      Shoulder Exercises: Supine   Protraction  Strengthening;Right;20 reps;Weights    Protraction Weight (lbs)  5      Shoulder Exercises: Prone   Extension  Strengthening;Right;20 reps;Weights    Extension Weight (lbs)  3    Horizontal ABduction 1  Strengthening;Right;20 reps;Weights    Horizontal ABduction 1 Weight (lbs)  2 then 3#    Horizontal ABduction 2  Strengthening;Right;20 reps;Weights    Horizontal ABduction 2 Weight (lbs)  2      Shoulder Exercises: Sidelying   External Rotation  Strengthening;Right;20 reps;Weights    External Rotation Weight (lbs)  2  then 3#      Shoulder Exercises: Standing   Shoulder Elevation  Strengthening;Both;Standing;5 reps;15 reps   5# bil   Shoulder Elevation Limitations  with active depression also    Other Standing Exercises  bicep curl to OH press 5# 2x10 bil      Manual Therapy   Manual Therapy  Soft tissue mobilization;Myofascial release;Joint mobilization    Joint Mobilization  mid T spine PA gd II/III    Soft tissue mobilization  to R UT and rhomboids    Myofascial Release  to R rhomboids along scapular border       Trigger Point Dry Needling - 10/18/17 1057    Consent Given?  Yes    Muscles Treated Upper Body  Upper trapezius;Levator scapulae;Rhomboids   Rt   Upper Trapezius Response  Twitch reponse elicited;Palpable increased muscle length    Levator Scapulae Response  Twitch response elicited    Rhomboids Response  Twitch response elicited;Palpable increased muscle length             PT Short Term Goals - 10/18/17 0803      PT SHORT TERM GOAL #1  Title  be independent in initial HEP    Time  4    Period  Weeks    Status  Achieved      PT SHORT TERM GOAL #2   Title  report a 25% reduction in Rt UE and neck pain with work tasks    Time  4    Period  Weeks    Status  Achieved      PT SHORT TERM GOAL #3   Title  report neutral posture and postural corrections at least 50% of the time with home and work tasks     Baseline  pt trying to be more aware of poor postural habits and proactive in correcting them    Time  4    Period  Weeks    Status  On-going        PT Long Term Goals - 09/26/17 0807      PT LONG TERM GOAL #1   Title  be independent in advanced HEP    Time  8    Period  Weeks    Status  New    Target Date  11/21/17      PT LONG TERM GOAL #2   Title  recuce FOTO to < or = to 36% limitation    Time  8    Period  Weeks    Status  New    Target Date  11/21/17      PT LONG TERM GOAL #3   Title  improve postural strength to report 50% improved enudrance  with use of Rt UE for repetative work tasks     Time  8    Period  Weeks    Status  New    Target Date  11/21/17      PT LONG TERM GOAL #4   Title  report a 60% reduction in neck and Rt UE pain with work tasks     Time  8    Period  Weeks    Status  New    Target Date  11/21/17      PT LONG TERM GOAL #5   Title  improve postural strength to demonstrate neutral posture > 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 She generally has pain the following day after working, but reports she was had a couple of very busy days with no pain afterward. She states she is trying hard to monitor her posture at work, but does catch herself in poor posture at her desk. She continues to have ++ twitch responses in R UT and tightness in R medial scapular muscles.     Rehab Potential  Excellent    PT Frequency  2x / week    PT Duration  8 weeks    PT Treatment/Interventions  ADLs/Self Care Home Management;Cryotherapy;Electrical Stimulation;Ultrasound;Moist Heat;Traction;Functional mobility training;Therapeutic activities;Therapeutic exercise;Patient/family education;Manual techniques;Passive range of motion;Taping;Dry needling    PT Next Visit Plan  continue upper back strengthening; DN prn; STW to R scapular muscles    PT Home Exercise Plan   Access Code: [email or web address]    Consulted and Agree with Plan of Care  Patient  Patient will benefit from skilled therapeutic intervention in order to improve the following deficits and impairments:  Decreased activity tolerance, Decreased endurance, Decreased range of motion, Decreased strength, Improper body mechanics, Pain, Postural dysfunction, Increased muscle spasms, Impaired UE functional use  Visit Diagnosis: Cervicalgia  Chronic right shoulder pain  Cramp and  spasm  Muscle weakness (generalized)     Problem List There are no active problems to display for this patient.   Jourdan Durbin PT 10/18/2017, 12:12 PM  [facility] [address] [place], [place], [address] Phone: [phone]   Fax:  [phone]  Name: Elaysia Devargas MRN: [id number] Date of Birth: 1990/03/27

## 2017-10-25 ENCOUNTER — Ambulatory Visit: Payer: Commercial Managed Care - PPO | Admitting: Physical Therapy

## 2017-10-25 DIAGNOSIS — G8929 Other chronic pain: Secondary | ICD-10-CM

## 2017-10-25 DIAGNOSIS — M542 Cervicalgia: Secondary | ICD-10-CM

## 2017-10-25 DIAGNOSIS — M25511 Pain in right shoulder: Secondary | ICD-10-CM

## 2017-10-25 DIAGNOSIS — M6281 Muscle weakness (generalized): Secondary | ICD-10-CM

## 2017-10-25 DIAGNOSIS — R252 Cramp and spasm: Secondary | ICD-10-CM

## 2017-10-25 NOTE — Therapy (Signed)
[facility] [address] [place], [place], [address] Phone: [phone]   Fax:  [phone]  Physical Therapy Treatment  Patient Details  Name: Shelley Henderson MRN: [id number] Date of Birth: December 09, 1990 Referring Provider (PT): Jarrett Soho, [place]   Encounter Date: 10/25/2017  PT End of Session - 10/25/17 0816    Visit Number  5    Date for PT Re-Evaluation  11/21/17    Authorization Type  UHC    PT Start Time  0803    PT Stop Time  0845    PT Time Calculation (min)  42 min    Activity Tolerance  Patient tolerated treatment well       Past Medical History:  Diagnosis Date  . Asthma     No past surgical history on file.  There were no vitals filed for this visit.  Subjective Assessment - 10/25/17 0804    Subjective  A little sore and tight this morning in left shoulder.  I've had a few days of pain but not as intense as it used to be.  The DN is helping.  I'm been doing the band ex's at home especially the last 3 days.      Currently in Pain?  Yes    Pain Score  2     Pain Location  Shoulder    Pain Orientation  Right    Pain Type  Chronic pain    Aggravating Factors   holding a heavy camera up;  sleeping on it         [facility] PT Assessment - 10/25/17 0001      AROM   Overall AROM Comments  cervical, thoracic and UE A/ROM is full      Strength   Overall Strength Comments  Rt=Lt 5/5 bil UE strength                   [facility] Adult PT Treatment/Exercise - 10/25/17 0001      Neck Exercises: Machines for Strengthening   UBE (Upper Arm Bike)  L3 x 4 min 80fwd/2bwd      Neck Exercises: Theraband   Shoulder External Rotation  15 reps;Green    Shoulder External Rotation Limitations  single and double arms     Horizontal ADduction Limitations  diagonals 10x each side     Horizontal ABduction  Green;20 reps   double and single arm   Other Theraband Exercises  overhead narrow and wide grip  green band 10x each     Other Theraband Exercises  red band Pallof press 5x  UEs only with SLS       Neck Exercises: Seated   Other Seated Exercise  thoracic extension with ball 10x at multiple levels       Shoulder Exercises: Supine   Protraction  Strengthening;Both;15 reps;Theraband    Theraband Level (Shoulder Protraction)  Level 3 (Green)      Shoulder Exercises: Sidelying   Other Sidelying Exercises  open books 10x right/left      Shoulder Exercises: Standing   Other Standing Exercises  red band backward Cs on wall 10x      Shoulder Exercises: ROM/Strengthening   Other ROM/Strengthening Exercises  quadruped thread the needle     Other ROM/Strengthening Exercises  red band steering wheel 45 sec       Shoulder Exercises: Power Radiation protection practitioner  25 reps    Extension Limitations  25#     Row  15  reps    Row Limitations  25#             PT Education - 10/25/17 1036    Education Details  supine green band scapular exercises    Person(s) Educated  Patient    Methods  Explanation;Demonstration;Handout    Comprehension  Returned demonstration;Verbalized understanding       PT Short Term Goals - 10/25/17 1041      PT SHORT TERM GOAL #1   Title  be independent in initial HEP    Status  Achieved      PT SHORT TERM GOAL #2   Title  report a 25% reduction in Rt UE and neck pain with work tasks    Status  Achieved      PT SHORT TERM GOAL #3   Title  report neutral posture and postural corrections at least 50% of the time with home and work tasks     Status  Achieved        PT Long Term Goals - 09/26/17 0807      PT LONG TERM GOAL #1   Title  be independent in advanced HEP    Time  8    Period  Weeks    Status  New    Target Date  11/21/17      PT LONG TERM GOAL #2   Title  recuce FOTO to < or = to 36% limitation    Time  8    Period  Weeks    Status  New    Target Date  11/21/17      PT LONG TERM GOAL #3   Title  improve postural strength to report  50% improved enudrance with use of Rt UE for repetative work tasks     Time  8    Period  Weeks    Status  New    Target Date  11/21/17      PT LONG TERM GOAL #4   Title  report a 60% reduction in neck and Rt UE pain with work tasks     Time  8    Period  Weeks    Status  New    Target Date  11/21/17      PT LONG TERM GOAL #5   Title  improve postural strength to demonstrate neutral posture > or = to 75% of the time with desk work and photography    Time  8    Period  Weeks    Status  New    Target Date  11/21/17            Plan - 10/25/17 0820    Clinical Impression Statement  The patient is able to perform intermediate level scapular strengthening exercises without exacerbation of pain but with muscular fatigue.  She declines the need for manual therapy or dry needling today.  Improving thoracic mobility.   Therapist closely monitoring response to exercise and for good postural alignment.   On track to meet rehab goals.      Rehab Potential  Excellent    PT Frequency  2x / week    PT Duration  8 weeks    PT Treatment/Interventions  ADLs/Self Care Home Management;Cryotherapy;Electrical Stimulation;Ultrasound;Moist Heat;Traction;Functional mobility training;Therapeutic activities;Therapeutic exercise;Patient/family education;Manual techniques;Passive range of motion;Taping;Dry needling    PT Next Visit Plan  continue upper back strengthening; DN prn; STW to R scapular muscles;  try green band scap ex's on foam roll ; try prone head  lift with UE lifts ; bird dogs;  SLS with green band diagonals   PT Home Exercise Plan   Access Code: [email or web address]       Patient will benefit from skilled therapeutic intervention in order to improve the following deficits and impairments:  Decreased activity tolerance, Decreased endurance, Decreased range of motion, Decreased strength, Improper body mechanics, Pain, Postural dysfunction, Increased muscle spasms, Impaired UE functional use  Visit  Diagnosis: Cervicalgia  Chronic right shoulder pain  Cramp and spasm  Muscle weakness (generalized)     Problem List There are no active problems to display for this patient.  Lavinia Sharps, PT 10/25/17 10:42 AM Phone: [phone] Fax: [phone]  Vivien Presto 10/25/2017, 10:42 AM  [facility] [address] [place], [place], [address] Phone: [phone]   Fax:  [phone]  Name: Jameica Couts MRN: [id number] Date of Birth: 1990/02/16

## 2017-10-25 NOTE — Patient Instructions (Signed)
  Over Head Pull: Narrow Grip       On back, knees bent, feet flat, band across thighs, elbows straight but relaxed. Pull hands apart (start). Keeping elbows straight, bring arms up and over head, hands toward floor. Keep pull steady on band. Hold momentarily. Return slowly, keeping pull steady, back to start. Repeat __10_ times. Band color ___green ___   Side Pull: Double Arm   On back, knees bent, feet flat. Arms perpendicular to body, shoulder level, elbows straight but relaxed. Pull arms out to sides, elbows straight. Resistance band comes across collarbones, hands toward floor. Hold momentarily. Slowly return to starting position. Repeat _10__ times. Band color __green___   Sash   On back, knees bent, feet flat, left hand on left hip, right hand above left. Pull right arm DIAGONALLY (hip to shoulder) across chest. Bring right arm along head toward floor. Hold momentarily. Slowly return to starting position. Repeat _10__ times. Do with left arm. Band color _green____   Shoulder Rotation: Double Arm   On back, knees bent, feet flat, elbows tucked at sides, bent 90, hands palms up. Pull hands apart and down toward floor, keeping elbows near sides. Hold momentarily. Slowly return to starting position. Repeat __10_ times. Band color ____green__     Nichelle Renwick PT [facility] [address], [address] [place], [place] [address] Phone # [phone] Fax [phone] 

## 2017-11-01 ENCOUNTER — Ambulatory Visit: Payer: Commercial Managed Care - PPO | Admitting: Physical Therapy

## 2017-11-01 ENCOUNTER — Encounter: Payer: Self-pay | Admitting: Physical Therapy

## 2017-11-01 DIAGNOSIS — R252 Cramp and spasm: Secondary | ICD-10-CM

## 2017-11-01 DIAGNOSIS — M25511 Pain in right shoulder: Secondary | ICD-10-CM

## 2017-11-01 DIAGNOSIS — M542 Cervicalgia: Secondary | ICD-10-CM

## 2017-11-01 DIAGNOSIS — G8929 Other chronic pain: Secondary | ICD-10-CM

## 2017-11-01 DIAGNOSIS — M6281 Muscle weakness (generalized): Secondary | ICD-10-CM

## 2017-11-01 NOTE — Therapy (Signed)
[facility] [address] [place], [place], [address] Phone: [phone]   Fax:  [phone]  Physical Therapy Treatment  Patient Details  Name: Shelley Henderson MRN: [id number] Date of Birth: 1990/10/06 Referring Provider (PT): Jarrett Soho, [place]   Encounter Date: 11/01/2017  PT End of Session - 11/01/17 1750    Visit Number  6    Date for PT Re-Evaluation  11/21/17    Authorization Type  UHC    PT Start Time  0800    PT Stop Time  0845    PT Time Calculation (min)  45 min    Activity Tolerance  Patient tolerated treatment well       Past Medical History:  Diagnosis Date  . Asthma     History reviewed. No pertinent surgical history.  There were no vitals filed for this visit.  Subjective Assessment - 11/01/17 0804    Subjective  I shot (photographed)  [employer] homecoming for 2 days and I was sore over the weekend.  It's always been manageable.  Overall 50% better.  No extreme pain now.      Currently in Pain?  No/denies    Pain Score  0-No pain         [facility] PT Assessment - 11/01/17 0001      Observation/Other Assessments   Focus on Therapeutic Outcomes (FOTO)   31% limitation                    [facility] Adult PT Treatment/Exercise - 11/01/17 0001      Neck Exercises: Machines for Strengthening   UBE (Upper Arm Bike)  L3 x 4 min 71fwd/2bwd   sitting on green ball      Neck Exercises: Prone   Axial Exension  10 reps    Other Prone Exercise  head lift with bil UE extension, horizontal abduction, UE lifts 10x each       Shoulder Exercises: ROM/Strengthening   Other ROM/Strengthening Exercises  upper trap and levator stretching     Other ROM/Strengthening Exercises  rhomboid stretching       Moist Heat Therapy   Number Minutes Moist Heat  5 Minutes    Moist Heat Location  Cervical      Manual Therapy   Soft tissue mobilization  Right upper trap, levator, subscapularis, infraspinatus         Trigger Point Dry Needling - 11/01/17 0818    Consent Given?  Yes    Muscles Treated Upper Body  Supraspinatus    Upper Trapezius Response  Twitch reponse elicited;Palpable increased muscle length    Levator Scapulae Response  Twitch response elicited;Palpable increased muscle length    Rhomboids Response  Twitch response elicited;Palpable increased muscle length    Infraspinatus Response  Palpable increased muscle length           PT Education - 11/01/17 0838    Education Details   Access Code: [email or web address] levator and rhomboid stretches    Person(s) Educated  Patient    Methods  Explanation;Demonstration;Handout    Comprehension  Returned demonstration;Verbalized understanding       PT Short Term Goals - 11/01/17 1749      PT SHORT TERM GOAL #1   Title  be independent in initial HEP    Status  Achieved      PT SHORT TERM GOAL #2   Title  report a 25% reduction in Rt UE and neck pain with work tasks  Status  Achieved      PT SHORT TERM GOAL #3   Title  report neutral posture and postural corrections at least 50% of the time with home and work tasks     Status  Achieved        PT Long Term Goals - 09/26/17 0807      PT LONG TERM GOAL #1   Title  be independent in advanced HEP    Time  8    Period  Weeks    Status  New    Target Date  11/21/17      PT LONG TERM GOAL #2   Title  recuce FOTO to < or = to 36% limitation    Time  8    Period  Weeks    Status  New    Target Date  11/21/17      PT LONG TERM GOAL #3   Title  improve postural strength to report 50% improved enudrance with use of Rt UE for repetative work tasks     Time  8    Period  Weeks    Status  New    Target Date  11/21/17      PT LONG TERM GOAL #4   Title  report a 60% reduction in neck and Rt UE pain with work tasks     Time  8    Period  Weeks    Status  New    Target Date  11/21/17      PT LONG TERM GOAL #5   Title  improve postural strength to demonstrate neutral posture >  or = to 75% of the time with desk work and photography    Time  8    Period  Weeks    Status  New    Target Date  11/21/17            Plan - 11/01/17 7829    Clinical Impression Statement  The patient has made excellent improvements in FOTO functional outcome score from 46% limitation to 31% limitation.  She does have tender points in upper traps, levator scap and rhomboid muscles.  Decreased tender point size and number following Dn and manual therapy.  Therapist closely monitoring response with all interventions.      Rehab Potential  Excellent    PT Frequency  2x / week    PT Duration  8 weeks    PT Treatment/Interventions  ADLs/Self Care Home Management;Cryotherapy;Electrical Stimulation;Ultrasound;Moist Heat;Traction;Functional mobility training;Therapeutic activities;Therapeutic exercise;Patient/family education;Manual techniques;Passive range of motion;Taping;Dry needling    PT Next Visit Plan  follow up in 2 weeks;  continue upper back strengthening; DN prn; STW to R scapular muscles;  try green band scap ex's on foam roll ; try prone head lift with UE lifts    PT Home Exercise Plan   Access Code: [email or web address]       Patient will benefit from skilled therapeutic intervention in order to improve the following deficits and impairments:  Decreased activity tolerance, Decreased endurance, Decreased range of motion, Decreased strength, Improper body mechanics, Pain, Postural dysfunction, Increased muscle spasms, Impaired UE functional use  Visit Diagnosis: Cervicalgia  Chronic right shoulder pain  Cramp and spasm  Muscle weakness (generalized)     Problem List There are no active problems to display for this patient.  Lavinia Sharps, PT 11/01/17 5:51 PM Phone: [phone] Fax: [phone]  Vivien Presto 11/01/2017, 5:51 PM  [facility] [address] [place], [place], [address] Phone: [phone]   Fax:   [phone]  Name: Shelley Henderson MRN: [id number] Date of Birth: 1990/04/24

## 2017-11-01 NOTE — Patient Instructions (Signed)
Access Code: [email or web address]  URL: [email or web address]  Date: 11/01/2017  Prepared by: Lavinia Sharps   Exercises  Cat-Camel - 10 reps - 1 sets - 5 hold - 2x daily - 7x weekly  Child's Pose Stretch - 3 reps - 2x daily - 7x weekly  Child's Pose with Sidebending - 3 reps - 2x daily - 7x weekly  Seated Cervical Flexion AROM - 3 reps - 1 sets - 20 hold - 3x daily - 7x weekly  Seated Cervical Sidebending AROM - 3 reps - 1 sets - 20 hold - 3x daily - 7x weekly  Seated Cervical Rotation AROM - 3 reps - 1 sets - 20 hold - 3x daily - 7x weekly  Seated Correct Posture - 10 reps - 3 sets - 1x daily - 7x weekly  Seated Levator Scapulae Stretch - 3 reps - 1 sets - 20 hold - 1x daily - 7x weekly  Doorway Rhomboid Stretch - 3 reps - 1 sets - 20 hold - 1x daily - 7x weekly  Seated Rhomboid Stretch - 3 reps - 1 sets - 20 hold - 1x daily - 7x weekly

## 2017-11-16 ENCOUNTER — Encounter: Payer: Self-pay | Admitting: Physical Therapy

## 2017-11-16 ENCOUNTER — Ambulatory Visit: Payer: Commercial Managed Care - PPO | Attending: Family Medicine | Admitting: Physical Therapy

## 2017-11-16 DIAGNOSIS — G8929 Other chronic pain: Secondary | ICD-10-CM | POA: Insufficient documentation

## 2017-11-16 DIAGNOSIS — M6281 Muscle weakness (generalized): Secondary | ICD-10-CM | POA: Diagnosis present

## 2017-11-16 DIAGNOSIS — M25511 Pain in right shoulder: Secondary | ICD-10-CM | POA: Diagnosis not present

## 2017-11-16 DIAGNOSIS — R252 Cramp and spasm: Secondary | ICD-10-CM | POA: Diagnosis present

## 2017-11-16 DIAGNOSIS — M542 Cervicalgia: Secondary | ICD-10-CM | POA: Insufficient documentation

## 2017-11-16 NOTE — Therapy (Signed)
[facility] [address], [place], [place], [address] Phone: [phone]   Fax:  [phone]  Physical Therapy Treatment/Discharge Summary   Patient Details  Name: Shelley Henderson MRN: [id number] Date of Birth: 1990-12-20 Referring Provider (PT): Marda Stalker, [place]   Encounter Date: 11/16/2017  PT End of Session - 11/16/17 1011    Visit Number  7    Date for PT Re-Evaluation  11/21/17    Authorization Type  UHC    PT Start Time  0927    PT Stop Time  1005    PT Time Calculation (min)  38 min    Activity Tolerance  Patient tolerated treatment well       Past Medical History:  Diagnosis Date  . Asthma     History reviewed. No pertinent surgical history.  There were no vitals filed for this visit.  Subjective Assessment - 11/16/17 0927    Subjective  I've been pretty good.  If it starts to get sore, I stretch and its fine.  Some knots in upper traps and scapular region.  Stress may be part of it.  I worked 10 days in a row.  Sleeping OK.  I'm about 75-80% better.      Currently in Pain?  No/denies    Pain Score  2     Pain Location  Scapula    Pain Descriptors / Indicators  Sore    Aggravating Factors   stress, carrying equipment          [facility] PT Assessment - 11/16/17 0001      Observation/Other Assessments   Focus on Therapeutic Outcomes (FOTO)   31% limitation       AROM   Overall AROM Comments  full cervical and shoulder ROM, painless       Strength   Overall Strength Comments  5/5 cervical, scapular and shoulder strength                    [facility] Adult PT Treatment/Exercise - 11/16/17 0001      Therapeutic Activites    Therapeutic Activities  ADL's    ADL's  postural correction especially with sitting      Exercises   Exercises  --   discussion of maintenance HEP; foam roll recommendation     Neck Exercises: Machines for Strengthening   UBE (Upper Arm Bike)  L3 x 4 min  15fd/2bwd   sitting on green ball      Neck Exercises: Theraband   Shoulder External Rotation  10 reps;Green   on foam roll    Horizontal ABduction  10 reps;Green   double and single arm; on foam roll   Other Theraband Exercises  overhead narrow and wide grip green band 10x each    on foam roll   Other Theraband Exercises  diagonals green band right/left 10x   on foam roll     Neck Exercises: Prone   Axial Exension  10 reps    Other Prone Exercise  head lift with bil UE extension, horizontal abduction, UE lifts 10x each     Other Prone Exercise  bird dogs UE/LE 5x       Manual Therapy   Soft tissue mobilization  Right upper trap, levator, subscapularis, infraspinatus        Trigger Point Dry Needling - 11/16/17 1007    Upper Trapezius Response  Twitch reponse elicited;Palpable increased muscle length    Levator Scapulae Response  Twitch  response elicited;Palpable increased muscle length             PT Short Term Goals - 11/16/17 1015      PT SHORT TERM GOAL #1   Title  be independent in initial HEP    Status  Achieved      PT SHORT TERM GOAL #2   Title  report a 25% reduction in Rt UE and neck pain with work tasks    Status  Achieved      PT SHORT TERM GOAL #3   Title  report neutral posture and postural corrections at least 50% of the time with home and work tasks     Status  Achieved        PT Long Term Goals - 11/16/17 1015      PT LONG TERM GOAL #1   Title  be independent in advanced HEP    Status  Achieved      PT LONG TERM GOAL #2   Title  recuce FOTO to < or = to 36% limitation    Status  Achieved      PT LONG TERM GOAL #3   Title  improve postural strength to report 50% improved enudrance with use of Rt UE for repetative work tasks     Status  Achieved      PT LONG TERM GOAL #4   Title  report a 60% reduction in neck and Rt UE pain with work tasks     Status  Achieved      PT LONG TERM GOAL #5   Title  improve postural strength to  demonstrate neutral posture > or = to 75% of the time with desk work and photography    Status  Achieved            Plan - 11/16/17 1012    Clinical Impression Statement  The patient reports she is 75-80% better overall.  She has full and painfree ROM in cervical, scapular and shoulder regions.  She has met all rehab goals and has been instructed in a comprehensive HEP for self managment long term.   Her FOTO functional outcome score has improved significantly to 31%.  She has responded especially well to dry needling and manual therapy.  Recommend discharge from PT at this time to an independent home exercise program.         Patient will benefit from skilled therapeutic intervention in order to improve the following deficits and impairments:     Visit Diagnosis: Cervicalgia  Chronic right shoulder pain  Cramp and spasm  Muscle weakness (generalized)    PHYSICAL THERAPY DISCHARGE SUMMARY  Visits from Start of Care: 7  Current functional level related to goals / functional outcomes: See clinical impressions above.  All goals met.     Remaining deficits: As above   Education / Equipment: Comprehensive HEP Plan: Patient agrees to discharge.  Patient goals were met. Patient is being discharged due to meeting the stated rehab goals.  ?????         Problem List There are no active problems to display for this patient.  Ruben Im, PT 11/16/17 10:18 AM Phone: [phone] Fax: [phone]  Alvera Singh 11/16/2017, 10:16 AM  [facility] [address] [place], [place], [address] Phone: [phone]   Fax:  [phone]  Name: Shelley Henderson MRN: [id number] Date of Birth: October 29, 1990

## 2017-11-27 DIAGNOSIS — Z23 Encounter for immunization: Secondary | ICD-10-CM | POA: Diagnosis not present
